# Patient Record
Sex: Female | Born: 1940 | Smoking: Never smoker
Health system: Southern US, Community
[De-identification: ages and names within clinical notes are randomized; demographics above are authoritative.]

## PROBLEM LIST (undated history)

## (undated) HISTORY — PX: ABDOMINAL HYSTERECTOMY: SHX81

---

## 2005-01-27 ENCOUNTER — Ambulatory Visit: Payer: Self-pay | Admitting: Family Medicine

## 2005-04-16 ENCOUNTER — Ambulatory Visit: Payer: Self-pay | Admitting: Family Medicine

## 2006-11-09 ENCOUNTER — Emergency Department: Payer: Self-pay | Admitting: Emergency Medicine

## 2010-05-26 ENCOUNTER — Emergency Department: Payer: Self-pay | Admitting: Emergency Medicine

## 2011-03-29 ENCOUNTER — Emergency Department: Payer: Self-pay | Admitting: *Deleted

## 2020-01-21 ENCOUNTER — Encounter: Payer: Self-pay | Admitting: Physician Assistant

## 2020-01-21 ENCOUNTER — Emergency Department
Admission: EM | Admit: 2020-01-21 | Discharge: 2020-01-21 | Disposition: A | Discharge status: Home / Self Care | Payer: Medicare Other | Attending: Student in an Organized Health Care Education/Training Program | Admitting: Student in an Organized Health Care Education/Training Program

## 2020-01-21 ENCOUNTER — Other Ambulatory Visit: Payer: Self-pay

## 2020-01-21 ENCOUNTER — Emergency Department: Payer: Medicare Other

## 2020-01-21 DIAGNOSIS — F0390 Unspecified dementia without behavioral disturbance: Secondary | ICD-10-CM | POA: Diagnosis not present

## 2020-01-21 DIAGNOSIS — Y92410 Unspecified street and highway as the place of occurrence of the external cause: Secondary | ICD-10-CM | POA: Insufficient documentation

## 2020-01-21 DIAGNOSIS — Y999 Unspecified external cause status: Secondary | ICD-10-CM | POA: Diagnosis not present

## 2020-01-21 DIAGNOSIS — Z041 Encounter for examination and observation following transport accident: Secondary | ICD-10-CM | POA: Insufficient documentation

## 2020-01-21 DIAGNOSIS — S161XXA Strain of muscle, fascia and tendon at neck level, initial encounter: Secondary | ICD-10-CM | POA: Diagnosis not present

## 2020-01-21 DIAGNOSIS — S199XXA Unspecified injury of neck, initial encounter: Secondary | ICD-10-CM | POA: Diagnosis present

## 2020-01-21 DIAGNOSIS — Y9389 Activity, other specified: Secondary | ICD-10-CM | POA: Diagnosis not present

## 2020-01-21 MED ORDER — ACETAMINOPHEN 325 MG PO TABS
650.0000 mg | ORAL_TABLET | Freq: Once | ORAL | Status: AC
  Administered 2020-01-21: 650 mg via ORAL
  Filled 2020-01-21: qty 2
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
## 2020-01-21 NOTE — Discharge Instructions (Addendum)
Follow-up with your regular doctor if not improving in 2 to 3 days.  Return emergency department worsening.  Follow-up with orthopedics if you continue to have neck pain in 1 week.  Take Tylenol for pain as needed.

## 2020-01-21 NOTE — ED Notes (Signed)
Jessica Doyle notified of BP 197/91, approved discharge with instructions for pt to follow up with primary care provider.
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
## 2021-06-23 ENCOUNTER — Other Ambulatory Visit: Payer: Self-pay

## 2021-06-23 ENCOUNTER — Emergency Department
Admission: EM | Admit: 2021-06-23 | Discharge: 2021-06-23 | Disposition: A | Discharge status: Home / Self Care | Payer: Medicare Other | Attending: Emergency Medicine | Admitting: Emergency Medicine

## 2021-06-23 ENCOUNTER — Emergency Department: Payer: Medicare Other

## 2021-06-23 DIAGNOSIS — R590 Localized enlarged lymph nodes: Secondary | ICD-10-CM | POA: Diagnosis not present

## 2021-06-23 DIAGNOSIS — R221 Localized swelling, mass and lump, neck: Secondary | ICD-10-CM | POA: Diagnosis present

## 2021-06-23 LAB — CBC WITH DIFFERENTIAL/PLATELET
Abs Immature Granulocytes: 0.02 10*3/uL (ref 0.00–0.07)
Basophils Absolute: 0 10*3/uL (ref 0.0–0.1)
Basophils Relative: 0 %
Eosinophils Absolute: 0.1 10*3/uL (ref 0.0–0.5)
Eosinophils Relative: 1 %
HCT: 28.6 % — ABNORMAL LOW (ref 36.0–46.0)
Hemoglobin: 8.9 g/dL — ABNORMAL LOW (ref 12.0–15.0)
Immature Granulocytes: 0 %
Lymphocytes Relative: 10 %
Lymphs Abs: 0.6 10*3/uL — ABNORMAL LOW (ref 0.7–4.0)
MCH: 23.2 pg — ABNORMAL LOW (ref 26.0–34.0)
MCHC: 31.1 g/dL (ref 30.0–36.0)
MCV: 74.5 fL — ABNORMAL LOW (ref 80.0–100.0)
Monocytes Absolute: 0.5 10*3/uL (ref 0.1–1.0)
Monocytes Relative: 8 %
Neutro Abs: 5.1 10*3/uL (ref 1.7–7.7)
Neutrophils Relative %: 81 %
Platelets: 444 10*3/uL — ABNORMAL HIGH (ref 150–400)
RBC: 3.84 MIL/uL — ABNORMAL LOW (ref 3.87–5.11)
RDW: 14.1 % (ref 11.5–15.5)
WBC: 6.3 10*3/uL (ref 4.0–10.5)
nRBC: 0 % (ref 0.0–0.2)

## 2021-06-23 LAB — BASIC METABOLIC PANEL
Anion gap: 8 (ref 5–15)
BUN: 19 mg/dL (ref 8–23)
CO2: 26 mmol/L (ref 22–32)
Calcium: 8.7 mg/dL — ABNORMAL LOW (ref 8.9–10.3)
Chloride: 99 mmol/L (ref 98–111)
Creatinine, Ser: 0.64 mg/dL (ref 0.44–1.00)
GFR, Estimated: >60 mL/min (ref >60)
Glucose, Bld: 101 mg/dL — ABNORMAL HIGH (ref 70–99)
Potassium: 4 mmol/L (ref 3.5–5.1)
Sodium: 133 mmol/L — ABNORMAL LOW (ref 135–145)

## 2021-06-23 MED ORDER — IOHEXOL 300 MG/ML  SOLN
75.0000 mL | Freq: Once | INTRAMUSCULAR | Status: AC | PRN
  Administered 2021-06-23: 75 mL via INTRAVENOUS
  Filled 2021-06-23: qty 75

## 2021-06-23 MED ORDER — AMOXICILLIN-POT CLAVULANATE 875-125 MG PO TABS: 1.0000 | ORAL_TABLET | Freq: Two times a day (BID) | ORAL | 0 refills | Status: AC

## 2021-06-23 MED ORDER — AMOXICILLIN-POT CLAVULANATE 875-125 MG PO TABS
1.0000 | ORAL_TABLET | Freq: Once | ORAL | Status: AC
  Administered 2021-06-23: 1 via ORAL
  Filled 2021-06-23: qty 1

## 2021-06-23 NOTE — ED Notes (Signed)
See triage note. Pt had 6 teeth extracted last week and within 2 or so days after extraction, began having swelling in L neck under ear, then about 1 week ago began with another swollen area in L lateral neck above clavicle. Area is about 5cm by 5cm, hard and movable. Denies pain, complains of itching in both areas.   Pt took prescribed abx, but did not take as directed at first.  Pt chews tobacco.   Accompanied by son.

## 2021-06-23 NOTE — ED Provider Notes (Signed)
[facility] Provider Note  Patient Contact: 4:20 PM (approximate)   History   neck swelling   HPI  Jessica Doyle is a 81 y.o. female presents to the ED accompanied by her son.  Patient has most recently had about 6 teeth removed from the lower jaw last week by local oral surgeon.  She was discharged with an antibiotic, but did not start antibiotic initially.  Her son will report that follow-up the day following the procedure she did begin develop swelling in a chain of nodules down her left neck.  Patient presents today noting that the nodules have not completely resolved, and one of them just above her collarbone appears quite large per patient denies any localized pain from the areas.  She denies any interim fevers, chills, sweats but has no difficulty breathing, swallowing, or controlling oral secretions.  She also does not have any ongoing oral lesions, swelling, bleeding, or irritation.  She has a history of current oral tobacco use. Patient presents to the ED for evaluation of ongoing swelling to the left side of the neck following dental procedure.  Physical Exam   Triage Vital Signs: ED Triage Vitals [06/23/21 1314]  Enc Vitals Group     BP (!) 164/91     Pulse Rate 88     Resp 16     Temp 97.8 F (36.6 C)     Temp Source Oral     SpO2 99 %     Weight      Height      Head Circumference      Peak Flow      Pain Score      Pain Loc      Pain Edu?      Excl. in [place]?     Most recent vital signs: Vitals:   06/23/21 1314 06/23/21 1810  BP: (!) 164/91 (!) 159/68  Pulse: 88 87  Resp: 16 16  Temp: 97.8 F (36.6 C)   SpO2: 99% 98%     General: Alert and in no acute distress. Head: No acute traumatic findings Mouth/Throat: Mucous membranes are moist.  Uvula is midline and tonsils are flat.  No oropharyngeal lesions are appreciated.  Patient with upper partial in place.  Lower partial is removed, and patient has bilateral mold is removed.  No  gum erosion, no focal gum swelling, and no erythema is appreciated.  No brawny sublingual edema is noted.  Normal range of motion of the mandible on exam.   Neck: No stridor. No cervical spine tenderness to palpation. Hematological/Lymphatic/Immunilogical: Palpable non-tender sublingual lymph node noted to the left portion of the mandible.  Palpable non-tender chain of anterior cervical lymph nodes on the right.  Large area of nodularity concerning for reactive supraclavicular lymph nodes.   Cardiovascular:  Good peripheral perfusion Respiratory: Normal respiratory effort without tachypnea or retractions. Lungs CTAB.  Musculoskeletal: Full range of motion to all extremities.  Neurologic:  No gross focal neurologic deficits are appreciated.  Skin:   No rash noted Other:   ED Results / Procedures / Treatments   Labs (all labs ordered are listed, but only abnormal results are displayed) Labs Reviewed  CBC WITH DIFFERENTIAL/PLATELET - Abnormal; Notable for the following components:      Result Value   RBC 3.84 (*)    Hemoglobin 8.9 (*)    HCT 28.6 (*)    MCV 74.5 (*)    MCH 23.2 (*)    Platelets 444 (*)  Lymphs Abs 0.6 (*)    All other components within normal limits  BASIC METABOLIC PANEL - Abnormal; Notable for the following components:   Sodium 133 (*)    Glucose, Bld 101 (*)    Calcium 8.7 (*)    All other components within normal limits  HIV ANTIBODY (ROUTINE TESTING W REFLEX)  QUANTIFERON-TB GOLD PLUS     EKG   RADIOLOGY  I personally viewed and evaluated these images as part of my medical decision making, as well as reviewing the written report by the radiologist.  ED Provider Interpretation: agree with report  DG Chest 2 View  Result Date: 06/23/2021 CLINICAL DATA:  Recent teeth extraction, left-sided neck swelling, lymphadenopathy on CT EXAM: CHEST - 2 VIEW COMPARISON:  06/23/2021, 05/26/2010 FINDINGS: Frontal and lateral views of the chest demonstrate an  unremarkable cardiac silhouette. Stable ectasia of the thoracic aorta, with prominent atherosclerosis of the aortic arch. No acute airspace disease, effusion, or pneumothorax. Specifically, no evidence of healed or active tuberculosis. There are no acute bony abnormalities. IMPRESSION: 1. No acute intrathoracic process. Electronically Signed   By: Randa Ngo M.D.   On: 06/23/2021 20:19   CT Soft Tissue Neck W Contrast  Result Date: 06/23/2021 CLINICAL DATA:  Soft tissue swelling, infection suspected EXAM: CT NECK WITH CONTRAST TECHNIQUE: Multidetector CT imaging of the neck was performed using the standard protocol following the bolus administration of intravenous contrast. RADIATION DOSE REDUCTION: This exam was performed according to the departmental dose-optimization program which includes automated exposure control, adjustment of the mA and/or kV according to patient size and/or use of iterative reconstruction technique. CONTRAST:  57mL OMNIPAQUE IOHEXOL 300 MG/ML  SOLN COMPARISON:  No prior neck CT, correlation is made with CT cervical spine 06/23/2019. FINDINGS: Pharynx and larynx: Rightward deviation of the larynx and pharynx, secondary to left neck lymphadenopathy. Otherwise normal, without mass or swelling. Salivary glands: No inflammation, mass, or stone. Thyroid: Normal. Lymph nodes: Multiple enlarged left neck lymph nodes, many of which contain multiple low-density, cystic lesions within them. A left level 1B lymph node measures up to 1.8 x 2.0 x 1.9 cm (series 5, image 49 and series 9, image 32), without cystic components. A large left level 2A and 2B conglomerate with multiple low-density lesions measures approximately 5.9 x 3.0 x 5.4 cm (series 5, image 26 and series 9, image 56), without definite separation between these lesions. A left level 5 lymph node conglomerate with multiple cystic lesions measures approximately 2.6 x 3.4 x 2.5 cm (series 5, image 42 and series 9, image 57) a left  level 3 lymph node conglomerate measures approximately 4.4 x 3.7 x 4.2 cm (series 5, image 51 and series 9, image 46). Left level 4 lymph nodes without definite cystic components measure up to 2.2 x 1.5 x 2.5 cm (series 5, image 66 and series 9, image 45). Additional multi cystic lymphadenopathy is also seen in the occipital lymph nodes (series 5, image 18) and lateral supraclavicular lymph nodes (series 5, image 61). Minimal surrounding fat stranding. Vascular: Patent arterial vasculature. Limited intracranial: Negative. Visualized orbits: Negative. Mastoids and visualized paranasal sinuses: Clear. Skeleton: Multilevel degenerative changes in the cervical spine. No acute osseous abnormality. Upper chest: Negative. Other: None. IMPRESSION: Necrotic lymphadenopathy, which could be infectious, including TB, versus nodal metastases. Consider histologic sampling. These results were called by telephone at the time of interpretation on 06/23/2021 at 7:34 pm to provider Artis Beggs , who verbally acknowledged these results. Electronically Signed  By: Merilyn Baba M.D.   On: 06/23/2021 19:35    PROCEDURES:  Critical Care performed: No  Procedures   MEDICATIONS ORDERED IN ED: Medications  amoxicillin-clavulanate (AUGMENTIN) 875-125 MG per tablet 1 tablet (has no administration in time range)  iohexol (OMNIPAQUE) 300 MG/ML solution 75 mL (75 mLs Intravenous Contrast Given 06/23/21 1820)     IMPRESSION / MDM / ASSESSMENT AND PLAN / ED COURSE  I reviewed the triage vital signs and the nursing notes.                              Differential diagnosis includes, but is not limited to, reactive lymph nodes, abscess, malignancy, Tb, mycobacterium, HIV  ----------------------------------------- 8:11 PM on 06/23/2021 ----------------------------------------- S/W Dr. Kathyrn Sheriff: he reviewed the case with me and suggested office evaluation and subsequent FNA with radiology. He suggests coverage with antibiotic  in case of infectious etiology.   Reactive patient with ED evaluation of several weeks of progressive left-sided left neck nodularity concerning for infection or reactive lymph nodes.  Patient has had some dental work performed on last few weeks, and had recently completed a course of antibiotic.  She presents in no acute distress, afebrile, and not endorsing any pain or tenderness to the lymph nodes.  Patient also denies any recent cough, hemoptysis, or unexplained weight loss.  She would endorse some subjective fevers over last few weeks again, which may be related to her recent dental procedure.  Exam is otherwise reassuring without any signs of an acute oral abscess and labs without any signs of acute leukocytosis, leukopenia, or emergent anermia.  Labs are pending at this time for HIV and TB quant interferon for further evaluation for underlying etiology.  Patient's diagnosis is consistent with necrotic cervical lymphadenopathy. Patient will be discharged home with prescriptions for Augmentin. Patient is to follow up with Dr. Kathyrn Sheriff as directed. Patient is given ED precautions to return to the ED for any worsening or new symptoms.    FINAL CLINICAL IMPRESSION(S) / ED DIAGNOSES   Final diagnoses:  Lymphadenopathy, cervical     Rx / DC Orders   ED Discharge Orders          Ordered    amoxicillin-clavulanate (AUGMENTIN) 875-125 MG tablet  2 times daily        06/23/21 2021             Note:  This document was prepared using Dragon voice recognition software and may include unintentional dictation errors.    Melvenia Needles, PA-C 06/23/21 2040    Delman Kitten, MD 06/24/21 2258

## 2021-06-23 NOTE — Discharge Instructions (Addendum)
Take the antibiotic as directed. Follow-up with Dr. Kathyrn Sheriff for further evaluation and to schedule the biopsy. Return to the ED if needed.

## 2021-06-23 NOTE — ED Triage Notes (Signed)
Pt states she had 6 teeth removed last week and had a knot form on her left side of neck since the knot has moved to her collar, denies any pain from it, states it just itches, denies any fever or other sx, has completed the abx that were prescribed.,

## 2021-06-24 LAB — HIV ANTIBODY (ROUTINE TESTING W REFLEX): HIV Screen 4th Generation wRfx: NONREACTIVE

## 2021-09-10 ENCOUNTER — Ambulatory Visit
Admission: RE | Admit: 2021-09-10 | Discharge: 2021-09-10 | Disposition: A | Discharge status: Home / Self Care | Payer: Medicare Other | Source: Ambulatory Visit | Attending: Emergency Medicine | Admitting: Emergency Medicine

## 2021-09-10 ENCOUNTER — Other Ambulatory Visit: Payer: Self-pay | Admitting: Emergency Medicine

## 2021-09-10 DIAGNOSIS — R221 Localized swelling, mass and lump, neck: Secondary | ICD-10-CM | POA: Diagnosis present

## 2021-09-10 LAB — POCT I-STAT CREATININE: Creatinine, Ser: 0.8 mg/dL (ref 0.44–1.00)

## 2021-09-10 MED ORDER — IOHEXOL 300 MG/ML  SOLN
75.0000 mL | Freq: Once | INTRAMUSCULAR | Status: AC | PRN
  Administered 2021-09-10: 75 mL via INTRAVENOUS

## 2021-09-18 ENCOUNTER — Inpatient Hospital Stay: Payer: Medicare Other

## 2021-09-18 ENCOUNTER — Inpatient Hospital Stay: Payer: Medicare Other | Admitting: Internal Medicine

## 2021-09-24 ENCOUNTER — Encounter: Payer: Self-pay | Admitting: Internal Medicine

## 2021-09-24 ENCOUNTER — Inpatient Hospital Stay: Payer: Medicare Other | Attending: Internal Medicine | Admitting: Internal Medicine

## 2021-09-24 ENCOUNTER — Inpatient Hospital Stay: Payer: Medicare Other

## 2021-09-24 DIAGNOSIS — D509 Iron deficiency anemia, unspecified: Secondary | ICD-10-CM

## 2021-09-24 DIAGNOSIS — R591 Generalized enlarged lymph nodes: Secondary | ICD-10-CM | POA: Insufficient documentation

## 2021-09-24 LAB — FERRITIN: Ferritin: 313 ng/mL — ABNORMAL HIGH (ref 11–307)

## 2021-09-24 LAB — CBC WITH DIFFERENTIAL/PLATELET
Abs Immature Granulocytes: 0.04 10*3/uL (ref 0.00–0.07)
Basophils Absolute: 0 10*3/uL (ref 0.0–0.1)
Basophils Relative: 0 %
Eosinophils Absolute: 0.1 10*3/uL (ref 0.0–0.5)
Eosinophils Relative: 2 %
HCT: 28.3 % — ABNORMAL LOW (ref 36.0–46.0)
Hemoglobin: 8.6 g/dL — ABNORMAL LOW (ref 12.0–15.0)
Immature Granulocytes: 1 %
Lymphocytes Relative: 5 %
Lymphs Abs: 0.4 10*3/uL — ABNORMAL LOW (ref 0.7–4.0)
MCH: 21.8 pg — ABNORMAL LOW (ref 26.0–34.0)
MCHC: 30.4 g/dL (ref 30.0–36.0)
MCV: 71.6 fL — ABNORMAL LOW (ref 80.0–100.0)
Monocytes Absolute: 0.9 10*3/uL (ref 0.1–1.0)
Monocytes Relative: 12 %
Neutro Abs: 5.9 10*3/uL (ref 1.7–7.7)
Neutrophils Relative %: 80 %
Platelets: 442 10*3/uL — ABNORMAL HIGH (ref 150–400)
RBC: 3.95 MIL/uL (ref 3.87–5.11)
RDW: 16.9 % — ABNORMAL HIGH (ref 11.5–15.5)
WBC: 7.3 10*3/uL (ref 4.0–10.5)
nRBC: 0 % (ref 0.0–0.2)

## 2021-09-24 LAB — TECHNOLOGIST SMEAR REVIEW: Plt Morphology: NORMAL

## 2021-09-24 LAB — IRON AND TIBC
Iron: 12 ug/dL — ABNORMAL LOW (ref 28–170)
Saturation Ratios: 5 % — ABNORMAL LOW (ref 10.4–31.8)
TIBC: 242 ug/dL — ABNORMAL LOW (ref 250–450)
UIBC: 230 ug/dL

## 2021-09-24 LAB — COMPREHENSIVE METABOLIC PANEL
ALT: 19 U/L (ref 0–44)
AST: 25 U/L (ref 15–41)
Albumin: 2.9 g/dL — ABNORMAL LOW (ref 3.5–5.0)
Alkaline Phosphatase: 62 U/L (ref 38–126)
Anion gap: 7 (ref 5–15)
BUN: 16 mg/dL (ref 8–23)
CO2: 29 mmol/L (ref 22–32)
Calcium: 8.8 mg/dL — ABNORMAL LOW (ref 8.9–10.3)
Chloride: 93 mmol/L — ABNORMAL LOW (ref 98–111)
Creatinine, Ser: 0.81 mg/dL (ref 0.44–1.00)
GFR, Estimated: >60 mL/min (ref >60)
Glucose, Bld: 107 mg/dL — ABNORMAL HIGH (ref 70–99)
Potassium: 4.7 mmol/L (ref 3.5–5.1)
Sodium: 129 mmol/L — ABNORMAL LOW (ref 135–145)
Total Bilirubin: 0.2 mg/dL — ABNORMAL LOW (ref 0.3–1.2)
Total Protein: 7.8 g/dL (ref 6.5–8.1)

## 2021-09-24 LAB — HEPATITIS C ANTIBODY: HCV Ab: NONREACTIVE

## 2021-09-24 LAB — HEPATITIS B SURFACE ANTIGEN: Hepatitis B Surface Ag: NONREACTIVE

## 2021-09-24 LAB — LACTATE DEHYDROGENASE: LDH: 355 U/L — ABNORMAL HIGH (ref 98–192)

## 2021-09-24 LAB — HEPATITIS B CORE ANTIBODY, IGM: Hep B C IgM: NONREACTIVE

## 2021-09-24 LAB — C-REACTIVE PROTEIN: CRP: 14.4 mg/dL — ABNORMAL HIGH (ref <1.0)

## 2021-09-24 NOTE — Assessment & Plan Note (Addendum)
#  Massive lymphadenopathy-bilateral neck left more than right; left axillary lymphadenopathy.  Highly concerning for malignancy like lymphoma. ? ?#I would recommend biopsy ultrasound-guided of the adenopathy, STAT.  Would also recommend stat CT scan chest abdomen pelvis.  ? ?#I had a long discussion with the patient and family-regarding above risk of malignancy-especially like diffuse B-cell lymphoma given the rapidity of the growth.  I also need for systemic therapy-like chemotherapy= again based on histology.  Consider bone marrow biopsy;  ? ?#Chronic microcytic anemia-question thalassemia versus iron deficiency.  See labs below ? ?#Recommend further work-up including CBC CMP LDH; hepatitis panel iron studies ferritin. ? ?Thank you for allowing me to participate in the care of your pleasant patient. Please do not hesitate to contact me with questions or concerns in the interim. ? ?# DISPOSITION: ?# labs- today ?# US guided Lymph node Biopsy STAT ?# follow up in 1 week; MD; no labs- Dr.B ? ?# I reviewed the blood work- with the patient in detail; also reviewed the imaging independently [as summarized above]; and with the patient in detail. # 60 minutes face-to-face with the patient discussing the above plan of care; more than 50% of time spent on prognosis/ natural history; counseling and coordination. ? ? ?

## 2021-09-24 NOTE — Progress Notes (Signed)
New patient evaluation. ? ?Temp 100.1 on office check today. ?

## 2021-09-24 NOTE — Progress Notes (Signed)
[facility] ?CONSULT NOTE ? ?Patient Care Team: ?Pcp, No as PCP - General ? ?CHIEF COMPLAINTS/PURPOSE OF CONSULTATION: lung nodule/mass ? ?#  ?Oncology History  ? No history exists.  ? ?IMPRESSION: ?Since the prior neck CT of 06/23/2021, there has been marked ?interval progression of extensive matted lymphadenopathy within the ?left parotid space, left suboccipital scalp, left submandibular ?space, throughout the majority of the remainder of the left neck and ?within the left retroclavicular region. As before, lymphadenopathy ?also extends into the visualized upper mediastinum. New from the ?prior exam, there is partially imaged extensive matted ?lymphadenopathy within the left axilla. Additionally, there is new ?lymphadenopathy at the bilateral level 1 stations, and new fairly ?extensive matted lymphadenopathy within the right mid-to-lower neck. ?Superimposed upon the extensive matted lymphadenopathy within the ?left face, neck and retroclavicular region, there are numerous ?hypodense, peripherally enhancing foci measuring up to 2.6 cm, which ?may reflect cystic/necrotic lymph nodes and/or abscesses. Primary ?differential considerations are unchanged and include infectious ?lymphadenitis (including but not limited to organisms such as ?Actinomyces or tuberculosis) versus extensive nodal metastatic ?disease. Direct tissue sampling should be considered at this time. ?Additionally, consider a dedicated chest CT to more fully assess the ?mediastinal/axillary lymphadenopathy. ?  ?As before, there is complete effacement of the left internal jugular ?vein throughout the majority of the left neck. New from the prior ?exam, there is moderate effacement of the right internal jugular ?vein within the right mid-to-lower neck due to the presence of new ?lymphadenopathy at this site. ?  ?Electronically Signed: ?By: Kellie Simmering D.O. ?On: 09/10/2021 14:23 ? ?#  ?  ? ?HISTORY OF PRESENTING ILLNESS: Patient in  wheelchair.  Accompanied by her son. ?Pryor Curia 81 y.o.  female  here for further evaluation and recommendations for neck lymphadenopathy. ? ?Patient noted to have swelling in the left neck approximately 3 months ago after she had teeth pulled.  She was treated with antibiotics.  She noted to have some improvement of her neck adenopathy.  However in January she had a CT scan that showed lymphadenopathy suggestive of malignancy at this time.  However she declined follow-up/work-up at this time. ? ?But more recently patient noted to have worsening neck adenopathy.  Otherwise denies any shortness of breath or cough.  Denies any fevers or chills.  Positive for weight loss.  Positive for fatigue.  Headaches.  No significant vision changes. ? ? ?Review of Systems  ?Constitutional:  Positive for malaise/fatigue and weight loss. Negative for chills, diaphoresis and fever.  ?HENT:  Negative for nosebleeds and sore throat.   ?Eyes:  Negative for double vision.  ?Respiratory:  Negative for cough, hemoptysis, sputum production, shortness of breath and wheezing.   ?Cardiovascular:  Negative for chest pain, palpitations, orthopnea and leg swelling.  ?Gastrointestinal:  Negative for abdominal pain, blood in stool, constipation, diarrhea, heartburn, melena, nausea and vomiting.  ?Genitourinary:  Negative for dysuria, frequency and urgency.  ?Musculoskeletal:  Negative for back pain and joint pain.  ?Skin: Negative.  Negative for itching and rash.  ?Neurological:  Positive for headaches. Negative for tingling, focal weakness and weakness.  ?Endo/Heme/Allergies:  Does not bruise/bleed easily.  ?Psychiatric/Behavioral:  Negative for depression. The patient is not nervous/anxious and does not have insomnia.    ? ?MEDICAL HISTORY:  ?History reviewed. No pertinent past medical history. ? ?SURGICAL HISTORY: ?Past Surgical History:  ?Procedure Laterality Date  ? ABDOMINAL HYSTERECTOMY    ? ? ?SOCIAL HISTORY: ?Social History   ? ?Socioeconomic History  ?  Marital status: Widowed  ?  Spouse name: Not on file  ? Number of children: Not on file  ? Years of education: Not on file  ? Highest education level: Not on file  ?Occupational History  ? Not on file  ?Tobacco Use  ? Smoking status: Never  ? Smokeless tobacco: Former  ?  Types: Chew  ?  Quit date: 09/24/2016  ?Vaping Use  ? Vaping Use: Never used  ?Substance and Sexual Activity  ? Alcohol use: Never  ? Drug use: Never  ? Sexual activity: Not Currently  ?Other Topics Concern  ? Not on file  ?Social History Narrative  ? Living in [place] since 1972; from [place]. No smoking/alcohol; chewed tobacco. Worked in [place]. Son lives close by.   ? ?Social Determinants of Health  ? ?Financial Resource Strain: Not on file  ?Food Insecurity: Not on file  ?Transportation Needs: Not on file  ?Physical Activity: Not on file  ?Stress: Not on file  ?Social Connections: Not on file  ?Intimate Partner Violence: Not on file  ? ? ?FAMILY HISTORY: ?Family History  ?Problem Relation Age of Onset  ? Leukemia Son   ? ? ?ALLERGIES:  has No Known Allergies. ? ?MEDICATIONS:  ?No current outpatient medications on file.  ? ?No current facility-administered medications for this visit.  ? ? ?  ?. ? ?PHYSICAL EXAMINATION: ?ECOG PERFORMANCE STATUS: 2 - Symptomatic, <50% confined to bed ? ?Vitals:  ? 09/24/21 1400  ?BP: 127/73  ?Pulse: 98  ?Resp: 16  ?Temp: 100.1 ?F (37.8 ?C)  ? ?Filed Weights  ? 09/24/21 1400  ?Weight: 94 lb 9.6 oz (42.9 kg)  ? ?Massive lymphadenopathy of the left neck compared to right.  Positive for left axillary adenopathy. ? ?Physical Exam ?Vitals and nursing note reviewed.  ?HENT:  ?   Head: Normocephalic and atraumatic.  ?   Mouth/Throat:  ?   Pharynx: Oropharynx is clear.  ?Eyes:  ?   Extraocular Movements: Extraocular movements intact.  ?   Pupils: Pupils are equal, round, and reactive to light.  ?Cardiovascular:  ?   Rate and Rhythm: Normal rate and regular rhythm.  ?Pulmonary:  ?   Comments:  Decreased breath sounds bilaterally.  ?Abdominal:  ?   Palpations: Abdomen is soft.  ?Musculoskeletal:     ?   General: Normal range of motion.  ?   Cervical back: Normal range of motion.  ?Skin: ?   General: Skin is warm.  ?Neurological:  ?   General: No focal deficit present.  ?   Mental Status: She is alert and oriented to person, place, and time.  ?Psychiatric:     ?   Behavior: Behavior normal.     ?   Judgment: Judgment normal.  ? ? ? ?LABORATORY DATA:  ?I have reviewed the data as listed ?Lab Results  ?Component Value Date  ? WBC 7.3 09/24/2021  ? HGB 8.6 (L) 09/24/2021  ? HCT 28.3 (L) 09/24/2021  ? MCV 71.6 (L) 09/24/2021  ? PLT 442 (H) 09/24/2021  ? ?Recent Labs  ?  06/23/21 ?1653 09/10/21 ?1331 09/24/21 ?1458  ?NA 133*  --  129*  ?K 4.0  --  4.7  ?CL 99  --  93*  ?CO2 26  --  29  ?GLUCOSE 101*  --  107*  ?BUN 19  --  16  ?CREATININE 0.64 0.80 0.81  ?CALCIUM 8.7*  --  8.8*  ?GFRNONAA >60  --  >60  ?PROT  --   --  7.8  ?ALBUMIN  --   --  2.9*  ?AST  --   --  25  ?ALT  --   --  19  ?ALKPHOS  --   --  62  ?BILITOT  --   --  0.2*  ? ? ?RADIOGRAPHIC STUDIES: ?I have personally reviewed the radiological images as listed and agreed with the findings in the report. ?CT SOFT TISSUE NECK W CONTRAST ? ?Addendum Date: 09/10/2021   ?ADDENDUM REPORT: 09/10/2021 19:33 ADDENDUM: These results were called to Dr. Lenise Arena by the Radiologist Assistant at 2:41 p.m. on 09/10/2021, and communication was documented in the [employer]. Electronically Signed   By: Kellie Simmering D.O.   On: 09/10/2021 19:33  ? ?Result Date: 09/10/2021 ?CLINICAL DATA:  Provided history: Left-sided neck mass. Additional history provided: Patient had multiple teeth fold in January 2023 with subsequent left-sided neck swelling. Neck CT in January 2023 showed enlarged/necrotic lymph nodes, swelling went down but has recurred and is now worse than prior. EXAM: CT NECK WITH CONTRAST TECHNIQUE: Multidetector CT imaging of the neck was performed  using the standard protocol following the bolus administration of intravenous contrast. RADIATION DOSE REDUCTION: This exam was performed according to the departmental dose-optimization program which includes automated exposure

## 2021-09-25 ENCOUNTER — Telehealth: Payer: Self-pay

## 2021-09-25 ENCOUNTER — Telehealth: Payer: Self-pay | Admitting: *Deleted

## 2021-09-25 ENCOUNTER — Ambulatory Visit
Admission: RE | Admit: 2021-09-25 | Discharge: 2021-09-25 | Disposition: A | Discharge status: Home / Self Care | Payer: Medicare Other | Source: Ambulatory Visit | Attending: Internal Medicine | Admitting: Internal Medicine

## 2021-09-25 DIAGNOSIS — R591 Generalized enlarged lymph nodes: Secondary | ICD-10-CM

## 2021-09-25 MED ORDER — IOHEXOL 300 MG/ML  SOLN
80.0000 mL | Freq: Once | INTRAMUSCULAR | Status: AC | PRN
  Administered 2021-09-25: 80 mL via INTRAVENOUS

## 2021-09-25 NOTE — Telephone Encounter (Signed)
Dr. B wants patient scheduled for CT scans- STAT. ? ?Patient's son notified and agrees.  Does request to have a 1 hour notice to allow time for him to have her ready and get here. ? ?Please schedule and call patient's son with appt details.   ?

## 2021-09-25 NOTE — Telephone Encounter (Signed)
Called report ? ?IMPRESSION: ?1. Bulky left axillary, subpectoral, and cervical lymphadenopathy, ?as well as less bulky mediastinal lymphadenopathy. ?2. 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 ?3. No evidence of lymphadenopathy or metastatic disease within the ?abdomen or pelvis. ?4. Cholelithiasis. ?5. Wedge deformity of T7, age indeterminate. Correlate for acute ?point tenderness. ?6. Coronary artery disease. ?  ?These results will be called to the ordering clinician or ?representative by the Radiologist Assistant, and communication ?documented in the PACS or [employer]. ?  ?Aortic Atherosclerosis (ICD10-I70.0). ?  ?  ?Electronically Signed ?  By: Delanna Ahmadi M.D. ?  On: 09/25/2021 15:23 ?  ?

## 2021-09-26 ENCOUNTER — Telehealth: Payer: Self-pay

## 2021-09-26 ENCOUNTER — Other Ambulatory Visit (HOSPITAL_COMMUNITY): Payer: Self-pay | Admitting: Radiology

## 2021-09-26 NOTE — Progress Notes (Signed)
Spoke with patient's Daughter-In-Law, Jenell Milliner, 09/26/21 @ 14:15. Offered sedation for upcoming bx, patient's Daugther-in-law declined. Advised to arrive at 1:00 for 1:30 procedure since sedation would not be needed. Daughter-in-law verbalized understanding. ?

## 2021-09-26 NOTE — Telephone Encounter (Signed)
Patient is scheduled for biopsy on 09/29/21.  Dr. B would like to move the appt from 4/26 to 4/28 at 8:45.  Patient's son is aware of the appt change. ? ?Dr. B also spoke to the patient's son, Mr. Primus Bravo, regarding the STAT CT results from yesterday.   ?

## 2021-09-26 NOTE — Telephone Encounter (Signed)
Dr. B spoke to patient's son regarding results. ?

## 2021-09-29 ENCOUNTER — Ambulatory Visit
Admission: RE | Admit: 2021-09-29 | Discharge: 2021-09-29 | Disposition: A | Discharge status: Home / Self Care | Payer: Medicare Other | Source: Ambulatory Visit | Attending: Internal Medicine | Admitting: Internal Medicine

## 2021-09-29 DIAGNOSIS — R591 Generalized enlarged lymph nodes: Secondary | ICD-10-CM | POA: Diagnosis present

## 2021-09-29 DIAGNOSIS — C8330 Diffuse large B-cell lymphoma, unspecified site: Secondary | ICD-10-CM | POA: Insufficient documentation

## 2021-09-29 NOTE — Procedures (Signed)
Interventional Radiology Procedure Note ? ?Procedure: US guided left neck lymph node biopsy ? ?Indication: Left neck lymph adenopathy ? ?Findings: Please refer to procedural dictation for full description. ? ?Complications: None ? ?EBL: < 10 mL ? ?Miachel Roux, MD ?[phone] ? ? ?

## 2021-09-30 ENCOUNTER — Telehealth: Payer: Self-pay | Admitting: Internal Medicine

## 2021-09-30 NOTE — Telephone Encounter (Signed)
Patient's son Merlinda Frederick) called and stated that he would not be able to bring patient in for her appointment on Friday (she had her biopsy today) because he is scheduled for knee replacement surgery tomorrow. He has requested that appointment be virtual--and confirmed that the patient would be present for the appointment.  ? ?Please advise.  ?

## 2021-10-01 ENCOUNTER — Inpatient Hospital Stay: Payer: Medicare Other | Admitting: Internal Medicine

## 2021-10-02 ENCOUNTER — Telehealth: Payer: Self-pay

## 2021-10-02 NOTE — Telephone Encounter (Signed)
The biopsy results may not be available for tomorrow's MD appointment.  Dr. B would like to have the appointment r/s next Tuesday in the morning in person visit/not virtual.  Please r/s as MD recommends at notify patient's son of appt details.   ? ?I have spoke to patient's son and he agrees with r/s plan.  He will not be available to accompany patient at in person visit but another family will come with her.  MD will connect with patient's son via phone at the appt.   ?

## 2021-10-03 ENCOUNTER — Inpatient Hospital Stay: Payer: Medicare Other | Admitting: Internal Medicine

## 2021-10-03 NOTE — Telephone Encounter (Signed)
Dr. B prefers in person visit but if patient is adamant about virtual due to son's availability, OK to schedule virtual on Tuesday (afternoon preferred).   ?

## 2021-10-07 ENCOUNTER — Inpatient Hospital Stay: Payer: Medicare Other | Attending: Internal Medicine | Admitting: Internal Medicine

## 2021-10-07 DIAGNOSIS — C8331 Diffuse large B-cell lymphoma, lymph nodes of head, face, and neck: Secondary | ICD-10-CM | POA: Diagnosis not present

## 2021-10-07 MED ORDER — OXYCODONE HCL 5 MG PO TABS: 5.0000 mg | ORAL_TABLET | ORAL | 0 refills | Status: AC | PRN

## 2021-10-07 MED ORDER — PREDNISONE 50 MG PO TABS: 50.0000 mg | ORAL_TABLET | Freq: Two times a day (BID) | ORAL | 0 refills | Status: AC

## 2021-10-07 NOTE — Progress Notes (Signed)
Patient verified using two identifiers for virtual visit via telephone today. ? ?Weakness, neck swelling/pain, and memory problem is increasing.  No appetite and drinks 3-4 Ensure a day.  Constipted and unsure of last bowel movent but when she did have a bowel movement it was dark.  Also not having good urine output. ?

## 2021-10-07 NOTE — Assessment & Plan Note (Addendum)
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  FISH panel pending.  Stage IIb.  Bone marrow biopsy/PET scan not done-see below ? ?#ECOG performance status-3-4. ? 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 ? ?PLAN:  ? ?#I had a long discussion with patient and family regarding the serious nature of the disease; and urgency to treat the disease.  Discussed reasonably good response rates from chemotherapy; and potential cure.  Her chance for cure is low-high risk IPI.  Given her rather acute decompensation over the last few months-patient will need to be admitted to hospital for therapeutic option.  Patient and family declined. ? ?#Patient/family request-pain control/symptomatic treatment at this time.  Recommend prednisone 50 mg twice daily 5 days.  Also recommend oxycodone 5 mg every 6 hours as needed. ? ?#Discussed that I would recommend hospice ASAP-so that patient could stay at home; and have a symptoms controlled.  ? ?Ample opportunity was given to the patient and family to answer all questions.  They are aware that they can always call back for any questions or concerns.  ? ? ?# DISPOSITION: ?#Hospice referral ASAP ?#Follow-up as needed ? ? ?

## 2021-10-07 NOTE — Progress Notes (Signed)
I connected with Jessica Doyle on 10/07/21 at  1:00 PM EDT by video enabled telemedicine visit and verified that I am speaking with the correct person using two identifiers.  ?I discussed the limitations, risks, security and privacy concerns of performing an evaluation and management service by telemedicine and the availability of in-person appointments. I also discussed with the patient that there may be a patient responsible charge related to this service. The patient expressed understanding and agreed to proceed.  ? ? ?Patient?s location: Home ?Provider?s location: Office ? ?Oncology History Overview Note  ?LYMPH NODE, LEFT NECK; ULTRASOUND-GUIDED CORE NEEDLE BIOPSY:  ?- POSITIVE FOR MALIGNANCY.  ?- DIFFUSE LARGE B-CELL LYMPHOMA, ACTIVATED B-CELL (NON-GERMINAL CENTER)  ?IMMUNOPHENOTYPE, WITH COEXPRESSION OF BCL-2 AND C-MYC BY  ?IMMUNOHISTOCHEMISTRY.  ? ?Comment:  ?Core biopsies display lymph node with completely effaced  ?immunoarchitecture.  Biopsy cores are comprised of nodules of abnormal  ?large cells, interspersed by dense fibrous bands, as well as focal  ?histiocytic aggregates. Evaluation of cytologic detail is somewhat  ?marred by dense fibrosis, however, abnormal cells display marked nuclear  ?pleomorphism, significant nuclear contour irregularities, increased  ?size, and frequent mitotic figures.  ? ?Initial immunohistochemical studies show tumor cells to be positive for  ?CD45 (LCA) and negative for CK7, CK20, GATA3, and TTF-1.  Additional  ?immunohistochemical studies identify the abnormal large cell population  ?as CD20+ B cells, which are positive for Bcl-2, Bcl-6, and Mum-1, and  ?negative for CD5, CD10 and CD30.  A c-myc stain appears positive,  ?staining greater than 30% of abnormal cells in the scant residual tissue  ?remaining. Ki-67 is significantly elevated, marking greater than 90% of  ?abnormal cells.  ? ?Taken together, the findings are compatible with a diagnosis of diffuse  ?large  B-cell lymphoma, with an activated B-cell immunophenotype by the  ?Hans criteria.  There appears to be co-expression of c-myc and Bcl-2 by  ?immunohistochemistry, typical of a "double expressor" immunophenotype,  ?although evaluation is somewhat limited by the small size of biopsy  ?cores as well as histologic crush artifact.  Ancillary FISH testing for  ?rearrangements of BCL2, BCL6, and MYC are pending, and will be reported  ?as an addendum.  ?  ?Diffuse large B-cell lymphoma of lymph nodes of neck ([place])  ?10/07/2021 Initial Diagnosis  ? Diffuse large B-cell lymphoma of lymph nodes of neck ([place]) ?  ?10/07/2021 Cancer Staging  ? Staging form: Hodgkin and Non-Hodgkin Lymphoma, AJCC 7th Edition ?- Clinical: Stage II (B - Symptoms) - Signed by Cammie Sickle, MD on 10/07/2021 ?Diagnostic confirmation: Positive histology ?Biopsy of metastatic site performed: No ?Source of metastatic specimen: Regional Lymph Nodes, Cervical Lymph Nodes ? ?  ? ? ? ?Chief Complaint: Lymphoma ? ? ?History of present illness:Jessica Doyle 81 y.o.  female with history of bilateral neck adenopathy left more than right is here to review the results of her bone marrow biopsy. ? ?Patient noted to have progressive weakness.  Progressive neck swelling and pain.  Difficulty with swallowing.  Poor appetite. ? ?Family also noted to have memory problem.  Patient has been unable to get out of bed/barely able to walk with some help.  Also noted to have constipation.  And also low urine output. ? ? ?Observation/objective: Patient no acute distress.  Visibly in mild distress because of neck pain.  Otherwise no use of accessory muscles.  She is accompanied by her son/daughter-in-law. ?Assessment and plan: ?Diffuse large B-cell lymphoma of lymph nodes of neck ([facility]) ?#Diffuse large  B-cell lymphoma-ABC subtype s/p core biopsy of the neck lymph node.  FISH panel pending.  Stage IIb.  Bone marrow biopsy/PET scan not done-see below ? ?#ECOG performance  status-3-4. ? 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 ? ?PLAN:  ? ?#I had a long discussion with patient and family regarding the serious nature of the disease; and urgency to treat the disease.  Discussed reasonably good response rates from chemotherapy; and potential cure.  Her chance for cure is low-high risk IPI.  Given her rather acute decompensation over the last few months-patient will need to be admitted to hospital for therapeutic option.  Patient and family declined. ? ?#Patient/family request-pain control/symptomatic treatment at this time.  Recommend prednisone 50 mg twice daily 5 days.  Also recommend oxycodone 5 mg every 6 hours as needed. ? ?#Discussed that I would recommend hospice ASAP-so that patient could stay at home; and have a symptoms controlled.  ? ?Ample opportunity was given to the patient and family to answer all questions.  They are aware that they can always call back for any questions or concerns.  ? ? ?# DISPOSITION: ?#Hospice referral ASAP ?#Follow-up as needed ? ? ? ?Follow-up instructions: ? ?I discussed the assessment and treatment plan with the patient.  The patient was provided an opportunity to ask questions and all were answered.  The patient agreed with the plan and demonstrated understanding of instructions. ? ?The patient was advised to call back or seek an in person evaluation if the symptoms worsen or if the condition fails to improve as anticipated. ? ?Dr. Charlaine Dalton ?[facility] ?10/07/2021 ?2:23 PM ?

## 2021-10-14 ENCOUNTER — Other Ambulatory Visit: Payer: Self-pay | Admitting: Hospice and Palliative Medicine

## 2021-10-14 ENCOUNTER — Encounter: Payer: Self-pay | Admitting: Internal Medicine

## 2021-10-14 LAB — SURGICAL PATHOLOGY

## 2021-10-14 MED ORDER — MORPHINE SULFATE (CONCENTRATE) 10 MG /0.5 ML PO SOLN: 5.0000 mg | ORAL | 0 refills | Status: DC | PRN

## 2021-10-14 NOTE — Progress Notes (Signed)
I received a call from hospice nurse Sarah. Patient is having difficulty swallowing meds. Will send Rx for morphine elixir.  ?

## 2021-11-21 ENCOUNTER — Other Ambulatory Visit: Payer: Self-pay | Admitting: Hospice and Palliative Medicine

## 2021-11-21 MED ORDER — MORPHINE SULFATE (CONCENTRATE) 10 MG /0.5 ML PO SOLN
5.0000 mg | ORAL | 0 refills | Status: AC | PRN
Start: 2021-11-21 — End: ?

## 2021-11-21 NOTE — Progress Notes (Signed)
Hospice nurse requested refill of morphine elixir. Rx sent to pharmacy.

## 2022-01-06 DEATH — deceased

## 2023-02-02 IMAGING — US US BIOPSY LYMPH NODE
1 series · 14 of 24 positions shown · non-contrast
Comparison: none

INDICATION: Left neck lymphadenopathy

[Series 1: us core biopsy (lymph nodes) · 14 of 24 slices shown]
[im 1/24]
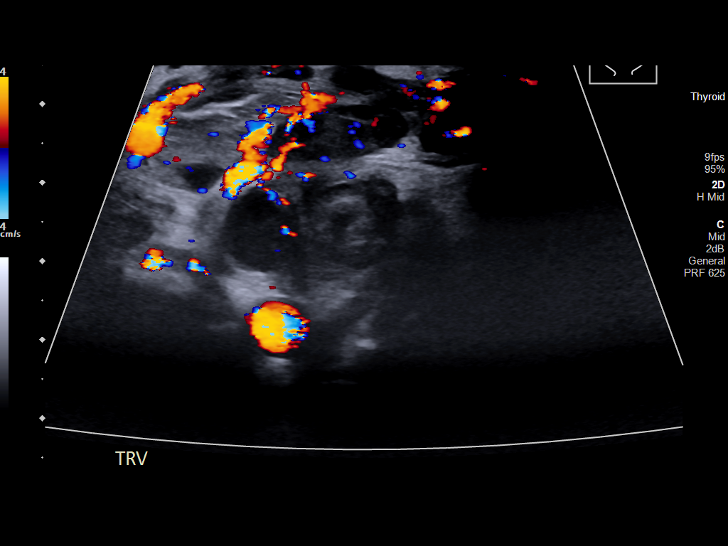
[im 3/24]
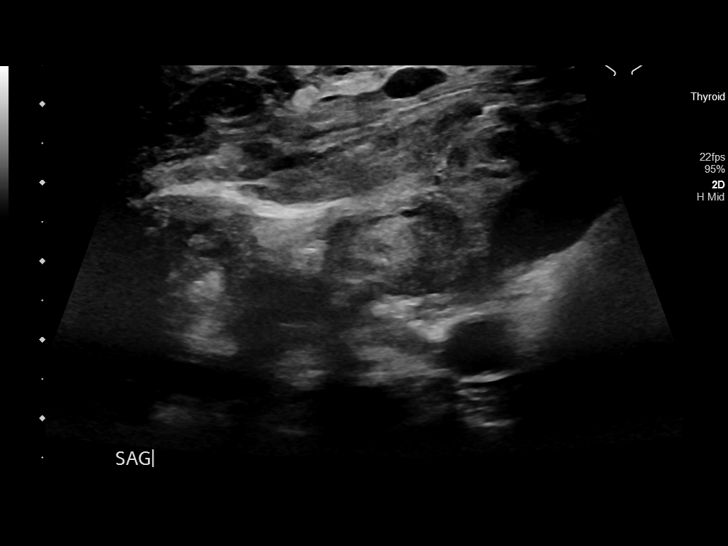
[im 5/24]
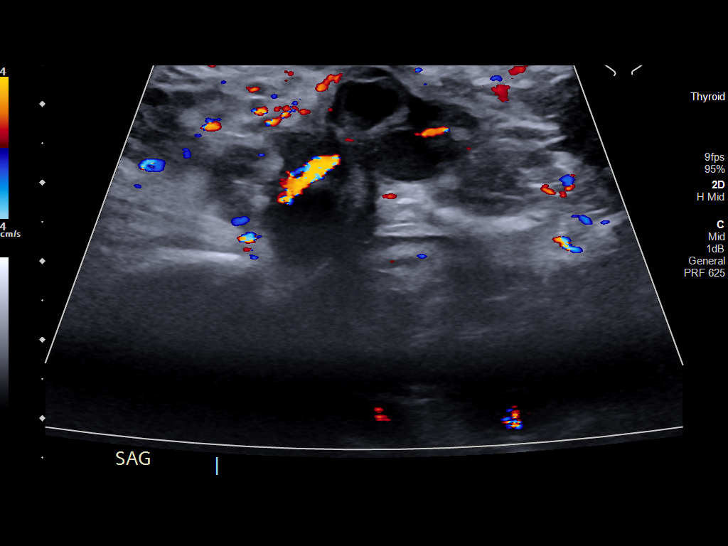
[im 7/24]
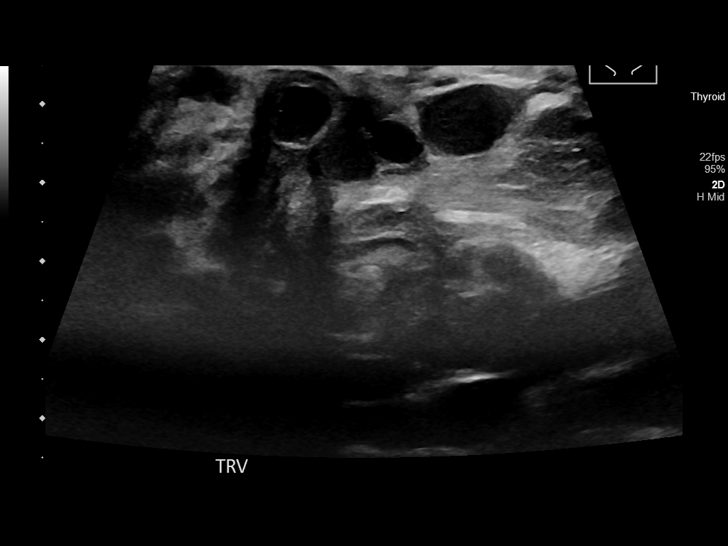
[im 8/24]
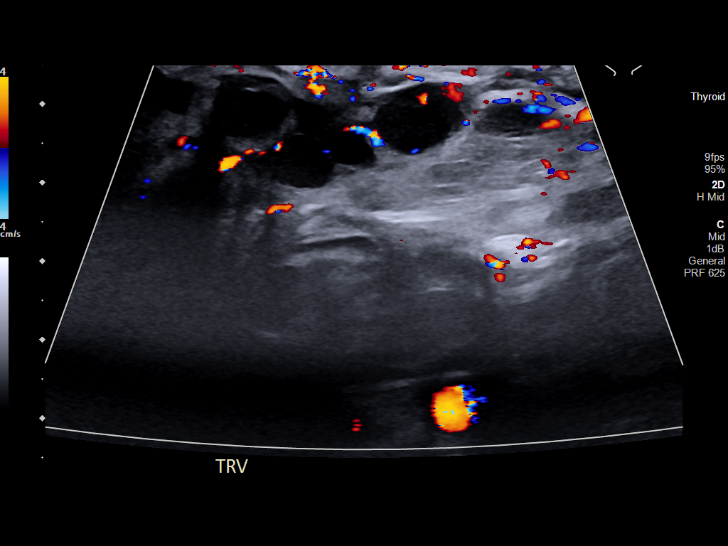
[im 10/24]
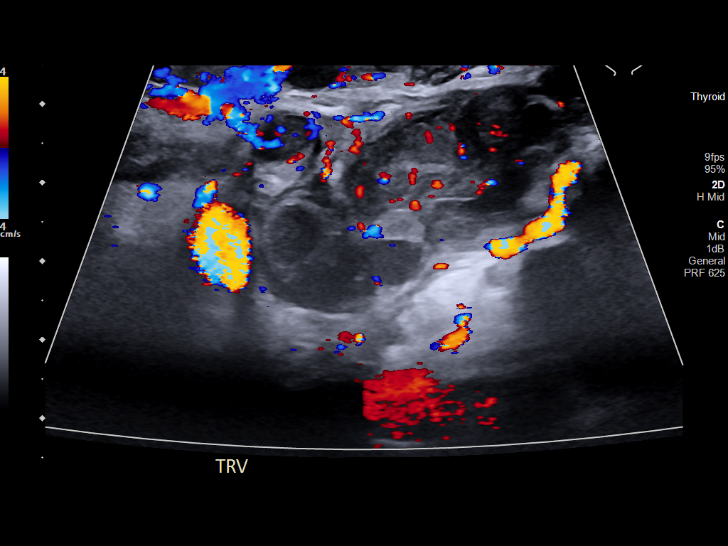
[im 12/24]
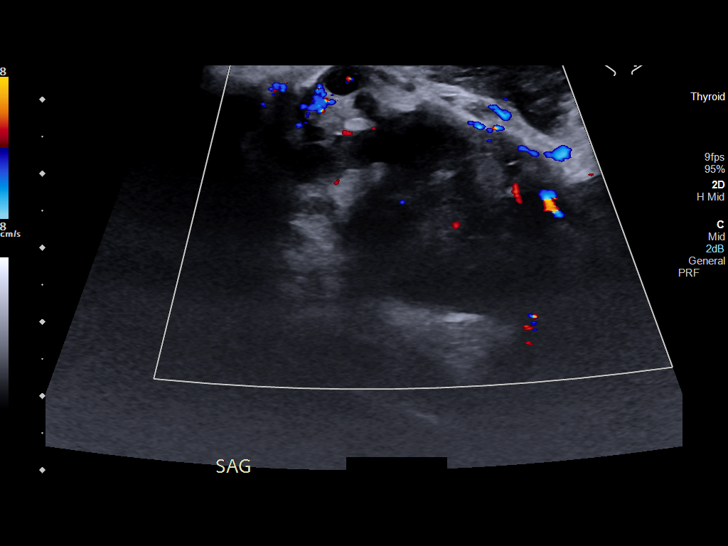
[im 13/24]
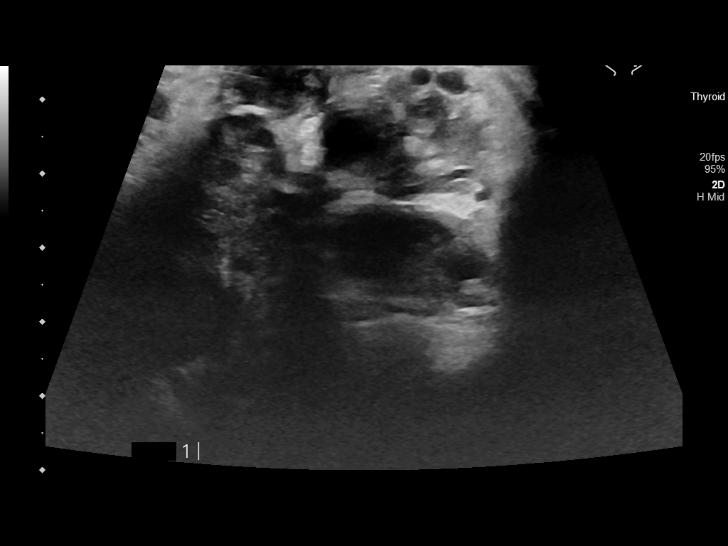
[im 15/24]
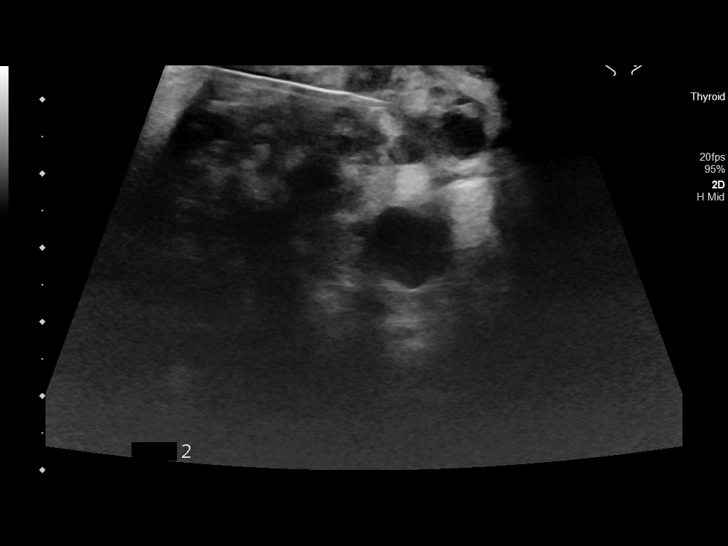
[im 17/24]
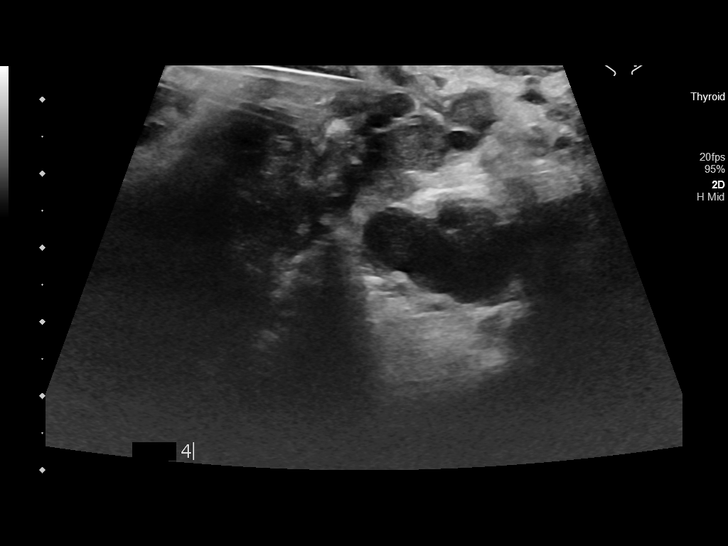
[im 19/24]
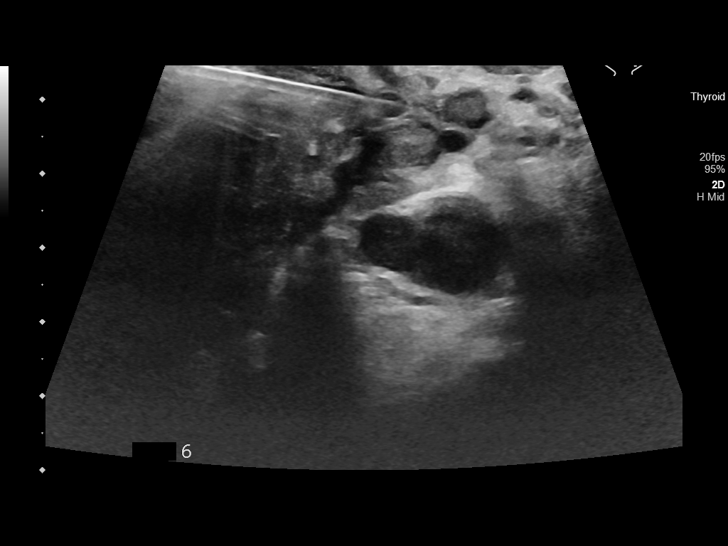
[im 20/24]
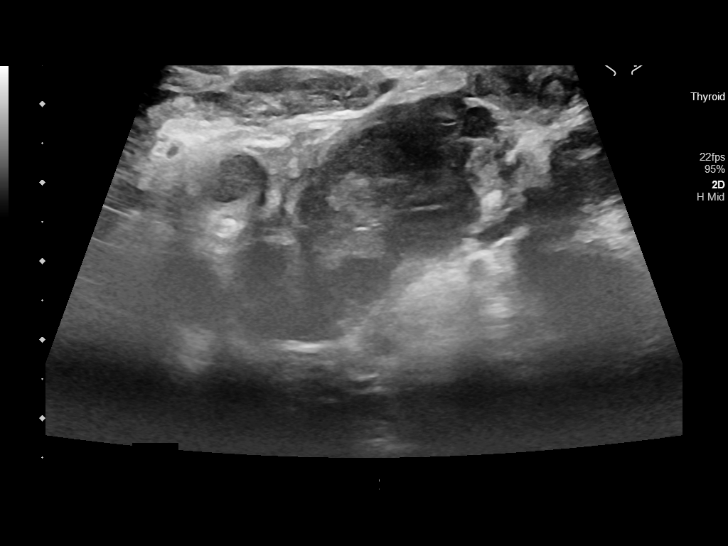
[im 22/24]
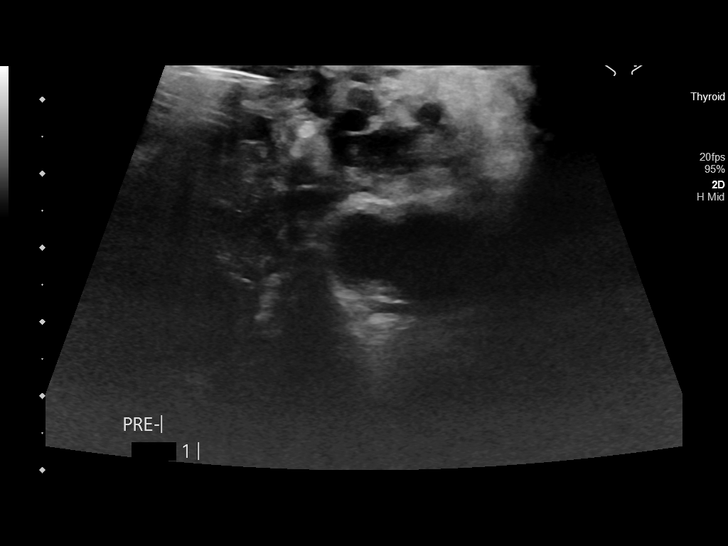
[im 24/24]
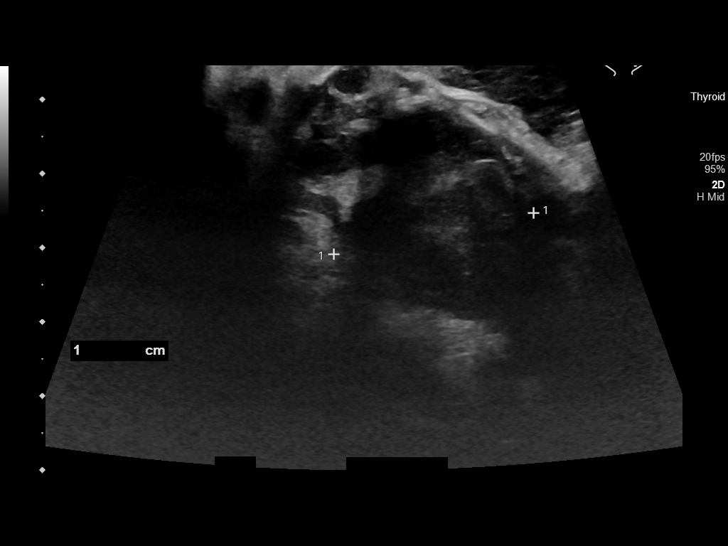

[14 of 24 positions shown; findings below may reference images not displayed]

EXAM:
Ultrasound-guided biopsy of enlarged left neck lymph node

MEDICATIONS:
None.

ANESTHESIA/SEDATION:
None

COMPLICATIONS:
None immediate.

PROCEDURE:
Informed written consent was obtained from the patient after a
thorough discussion of the procedural risks, benefits and
alternatives. All questions were addressed. Maximal Sterile Barrier
Technique was utilized including caps, mask, sterile gowns, sterile
gloves, sterile drape, hand hygiene and skin antiseptic. A timeout
was performed prior to the initiation of the procedure.

Patient position supine on the procedure table. Ultrasound
evaluation of the neck again showed extensive lymphadenopathy. The
overlying skin was prepped and draped in usual fashion. Following
local administration, 6-18 gauge cores were obtained from enlarged
left neck lymph node utilizing continuous ultrasound guidance. All
samples were sent to pathology in sterile saline.
IMPRESSION: Ultrasound-guided core needle biopsy of enlarged left neck lymph
node.
